# Patient Record
Sex: Female | Born: 1975 | Marital: Married | Smoking: Never smoker
Health system: Southern US, Community
[De-identification: ages and names within clinical notes are randomized; demographics above are authoritative.]

## PROBLEM LIST (undated history)

## (undated) DIAGNOSIS — F329 Major depressive disorder, single episode, unspecified: Secondary | ICD-10-CM

## (undated) DIAGNOSIS — F32A Depression, unspecified: Secondary | ICD-10-CM

## (undated) HISTORY — PX: OTHER SURGICAL HISTORY: SHX169

## (undated) HISTORY — PX: TOE SURGERY: SHX1073

## (undated) HISTORY — PX: MOUTH SURGERY: SHX715

---

## 2008-07-30 ENCOUNTER — Inpatient Hospital Stay (HOSPITAL_COMMUNITY): Admission: EM | Admit: 2008-07-30 | Discharge: 2008-07-31 | Payer: Self-pay | Admitting: Psychiatry

## 2008-07-30 ENCOUNTER — Ambulatory Visit: Payer: Self-pay | Admitting: Psychiatry

## 2008-07-30 ENCOUNTER — Emergency Department (HOSPITAL_COMMUNITY): Admission: EM | Admit: 2008-07-30 | Discharge: 2008-07-30 | Payer: Self-pay | Admitting: Emergency Medicine
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
## 2010-09-18 LAB — COMPREHENSIVE METABOLIC PANEL
AST: 18 U/L (ref 0–37)
BUN: 5 mg/dL — ABNORMAL LOW (ref 6–23)
CO2: 22 mEq/L (ref 19–32)
Chloride: 110 mEq/L (ref 96–112)
Creatinine, Ser: 0.78 mg/dL (ref 0.4–1.2)
GFR calc non Af Amer: >60 mL/min (ref >60)
Glucose, Bld: 99 mg/dL (ref 70–99)
Total Bilirubin: 0.8 mg/dL (ref 0.3–1.2)

## 2010-09-18 LAB — DIFFERENTIAL
Basophils Absolute: 0 10*3/uL (ref 0.0–0.1)
Lymphocytes Relative: 31 % (ref 12–46)
Lymphs Abs: 2.4 10*3/uL (ref 0.7–4.0)
Monocytes Relative: 7 % (ref 3–12)
Neutrophils Relative %: 57 % (ref 43–77)

## 2010-09-18 LAB — CBC
HCT: 36.9 % (ref 36.0–46.0)
Hemoglobin: 12.2 g/dL (ref 12.0–15.0)
MCV: 100.3 fL — ABNORMAL HIGH (ref 78.0–100.0)
RBC: 3.68 MIL/uL — ABNORMAL LOW (ref 3.87–5.11)
WBC: 7.6 10*3/uL (ref 4.0–10.5)

## 2010-09-18 LAB — RAPID URINE DRUG SCREEN, HOSP PERFORMED
Barbiturates: NOT DETECTED
Cocaine: NOT DETECTED
Opiates: NOT DETECTED
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
## 2011-06-04 ENCOUNTER — Encounter: Payer: Self-pay | Admitting: Emergency Medicine

## 2011-06-04 ENCOUNTER — Emergency Department (HOSPITAL_BASED_OUTPATIENT_CLINIC_OR_DEPARTMENT_OTHER)
Admission: EM | Admit: 2011-06-04 | Discharge: 2011-06-05 | Disposition: A | Discharge status: Home / Self Care | Payer: BC Managed Care – PPO | Attending: Emergency Medicine | Admitting: Emergency Medicine

## 2011-06-04 DIAGNOSIS — F3289 Other specified depressive episodes: Secondary | ICD-10-CM | POA: Insufficient documentation

## 2011-06-04 DIAGNOSIS — F329 Major depressive disorder, single episode, unspecified: Secondary | ICD-10-CM

## 2011-06-04 HISTORY — DX: Depression, unspecified: F32.A

## 2011-06-04 HISTORY — DX: Major depressive disorder, single episode, unspecified: F32.9

## 2011-06-04 LAB — CBC
MCH: 32.4 pg (ref 26.0–34.0)
Platelets: 204 10*3/uL (ref 150–400)
RBC: 4.29 MIL/uL (ref 3.87–5.11)

## 2011-06-04 LAB — COMPREHENSIVE METABOLIC PANEL
ALT: 11 U/L (ref 0–35)
AST: 18 U/L (ref 0–37)
CO2: 24 mEq/L (ref 19–32)
Calcium: 9.8 mg/dL (ref 8.4–10.5)
Sodium: 138 mEq/L (ref 135–145)
Total Protein: 8.7 g/dL — ABNORMAL HIGH (ref 6.0–8.3)

## 2011-06-04 MED ORDER — LORAZEPAM 1 MG PO TABS
1.0000 mg | ORAL_TABLET | Freq: Once | ORAL | Status: AC
  Administered 2011-06-04: 1 mg via ORAL
  Filled 2011-06-04: qty 1

## 2011-06-04 NOTE — ED Notes (Signed)
Jaime with [facility] called and sts he will be here in apprx. 1.5hrs. Pt and husband notified.
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
## 2011-06-04 NOTE — ED Notes (Signed)
Marijean Niemann with [facility] sts that he recommends pt be d/c'ed home. He sts the pt already has an appt with her psychiatrist and he feels she can be treated on an outpatient basis.
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
## 2011-06-04 NOTE — ED Notes (Signed)
Jaime with [facility] remains at bedside.

## 2011-06-04 NOTE — ED Notes (Signed)
I spoke with Thurston Hole at [facility] and sts they will call back with an ETA.

## 2011-06-04 NOTE — ED Notes (Signed)
Jaime with [facility] is present in the ED at this time.
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
## 2011-06-05 ENCOUNTER — Ambulatory Visit (HOSPITAL_COMMUNITY)
Admission: RE | Admit: 2011-06-05 | Discharge: 2011-06-05 | Disposition: A | Discharge status: Home / Self Care | Payer: BC Managed Care – PPO | Attending: Psychiatry | Admitting: Psychiatry

## 2012-10-13 ENCOUNTER — Other Ambulatory Visit: Payer: Self-pay | Admitting: Nurse Practitioner

## 2012-10-13 DIAGNOSIS — Z1231 Encounter for screening mammogram for malignant neoplasm of breast: Secondary | ICD-10-CM

## 2012-11-05 ENCOUNTER — Ambulatory Visit
Admission: RE | Admit: 2012-11-05 | Discharge: 2012-11-05 | Disposition: A | Discharge status: Home / Self Care | Payer: BC Managed Care – PPO | Source: Ambulatory Visit | Attending: Nurse Practitioner | Admitting: Nurse Practitioner

## 2012-11-05 DIAGNOSIS — Z1231 Encounter for screening mammogram for malignant neoplasm of breast: Secondary | ICD-10-CM

## 2012-11-06 ENCOUNTER — Other Ambulatory Visit: Payer: Self-pay | Admitting: Nurse Practitioner

## 2012-11-06 DIAGNOSIS — R928 Other abnormal and inconclusive findings on diagnostic imaging of breast: Secondary | ICD-10-CM

## 2012-11-09 ENCOUNTER — Other Ambulatory Visit: Payer: Self-pay | Admitting: Nurse Practitioner

## 2012-11-09 DIAGNOSIS — R928 Other abnormal and inconclusive findings on diagnostic imaging of breast: Secondary | ICD-10-CM

## 2012-11-26 ENCOUNTER — Other Ambulatory Visit: Payer: Self-pay

## 2012-12-03 ENCOUNTER — Ambulatory Visit
Admission: RE | Admit: 2012-12-03 | Discharge: 2012-12-03 | Disposition: A | Discharge status: Home / Self Care | Payer: BC Managed Care – PPO | Source: Ambulatory Visit | Attending: Nurse Practitioner | Admitting: Nurse Practitioner

## 2012-12-03 DIAGNOSIS — R928 Other abnormal and inconclusive findings on diagnostic imaging of breast: Secondary | ICD-10-CM

## 2015-06-08 ENCOUNTER — Emergency Department (HOSPITAL_COMMUNITY): Payer: BLUE CROSS/BLUE SHIELD

## 2015-06-08 ENCOUNTER — Encounter (HOSPITAL_COMMUNITY): Payer: Self-pay | Admitting: *Deleted

## 2015-06-08 ENCOUNTER — Inpatient Hospital Stay (EMERGENCY_DEPARTMENT_HOSPITAL)
Admission: AD | Admit: 2015-06-08 | Discharge: 2015-06-08 | Disposition: A | Discharge status: Home / Self Care | Payer: BLUE CROSS/BLUE SHIELD | Source: Ambulatory Visit | Attending: Family Medicine | Admitting: Family Medicine

## 2015-06-08 ENCOUNTER — Emergency Department (HOSPITAL_COMMUNITY)
Admission: EM | Admit: 2015-06-08 | Discharge: 2015-06-08 | Disposition: A | Discharge status: Home / Self Care | Payer: BLUE CROSS/BLUE SHIELD | Attending: Emergency Medicine | Admitting: Emergency Medicine

## 2015-06-08 ENCOUNTER — Encounter (HOSPITAL_COMMUNITY): Payer: Self-pay | Admitting: Emergency Medicine

## 2015-06-08 DIAGNOSIS — Z880 Allergy status to penicillin: Secondary | ICD-10-CM | POA: Insufficient documentation

## 2015-06-08 DIAGNOSIS — Z79899 Other long term (current) drug therapy: Secondary | ICD-10-CM | POA: Insufficient documentation

## 2015-06-08 DIAGNOSIS — R0602 Shortness of breath: Secondary | ICD-10-CM | POA: Insufficient documentation

## 2015-06-08 DIAGNOSIS — R5383 Other fatigue: Secondary | ICD-10-CM | POA: Insufficient documentation

## 2015-06-08 DIAGNOSIS — R55 Syncope and collapse: Secondary | ICD-10-CM | POA: Diagnosis not present

## 2015-06-08 DIAGNOSIS — R079 Chest pain, unspecified: Secondary | ICD-10-CM | POA: Diagnosis not present

## 2015-06-08 DIAGNOSIS — Z8659 Personal history of other mental and behavioral disorders: Secondary | ICD-10-CM | POA: Diagnosis not present

## 2015-06-08 DIAGNOSIS — R531 Weakness: Secondary | ICD-10-CM | POA: Diagnosis not present

## 2015-06-08 DIAGNOSIS — R200 Anesthesia of skin: Secondary | ICD-10-CM | POA: Diagnosis not present

## 2015-06-08 LAB — BASIC METABOLIC PANEL
ANION GAP: 7 (ref 5–15)
BUN: 8 mg/dL (ref 6–20)
CALCIUM: 9.5 mg/dL (ref 8.9–10.3)
CO2: 26 mmol/L (ref 22–32)
Chloride: 105 mmol/L (ref 101–111)
Creatinine, Ser: 0.81 mg/dL (ref 0.44–1.00)
GFR calc Af Amer: >60 mL/min (ref >60)
Glucose, Bld: 103 mg/dL — ABNORMAL HIGH (ref 65–99)
Potassium: 4.6 mmol/L (ref 3.5–5.1)
Sodium: 138 mmol/L (ref 135–145)

## 2015-06-08 LAB — CBC
HCT: 41.9 % (ref 36.0–46.0)
HEMOGLOBIN: 13.1 g/dL (ref 12.0–15.0)
MCH: 32.5 pg (ref 26.0–34.0)
MCHC: 31.3 g/dL (ref 30.0–36.0)
MCV: 104 fL — ABNORMAL HIGH (ref 78.0–100.0)
Platelets: 290 10*3/uL (ref 150–400)
RBC: 4.03 MIL/uL (ref 3.87–5.11)
RDW: 13.1 % (ref 11.5–15.5)
WBC: 8.5 10*3/uL (ref 4.0–10.5)

## 2015-06-08 LAB — URINALYSIS, ROUTINE W REFLEX MICROSCOPIC
Bilirubin Urine: NEGATIVE
Glucose, UA: NEGATIVE mg/dL
HGB URINE DIPSTICK: NEGATIVE
KETONES UR: NEGATIVE mg/dL
LEUKOCYTES UA: NEGATIVE
Nitrite: NEGATIVE
PROTEIN: NEGATIVE mg/dL
Specific Gravity, Urine: 1.025 (ref 1.005–1.030)
pH: 6 (ref 5.0–8.0)

## 2015-06-08 LAB — I-STAT TROPONIN, ED: TROPONIN I, POC: 0 ng/mL (ref 0.00–0.08)

## 2015-06-08 LAB — D-DIMER, QUANTITATIVE (NOT AT ARMC)

## 2015-06-08 LAB — POCT PREGNANCY, URINE: PREG TEST UR: NEGATIVE

## 2015-06-08 NOTE — MAU Note (Signed)
Yesterday they were coming from [place].  As they were getting close to landing, started coughing. Once they landed felt like there was a lot of mucous in her throat and upper chest, as she was walking her arms and leg felt weak and she felt like she couldn't breath.  Pt passed out.  Husband sat her down; gave her a couple hits off inhaler. People came to her assistance.  Started breathing ok.  Later  Had numbness in rt arm and leg. Pt refused to go to ER.   Around 10 last night had a sharp pain in chest, took a couple aspirin-helped.  Had a 2nd pain later, when coming from [place] around 0100.   No pain or numbness this morning, just feels weak. No prior cardiac hx

## 2015-06-08 NOTE — Discharge Instructions (Signed)
Schedule a follow up appointment with one of the primary care providers listed in the resource guide.   Chest Pain Observation It is often hard to give a specific diagnosis for the cause of chest pain. Among other possibilities your symptoms might be caused by inadequate oxygen delivery to your heart (angina). Angina that is not treated or evaluated can lead to a heart attack (myocardial infarction) or death. Blood tests, electrocardiograms, and X-rays may have been done to help determine a possible cause of your chest pain. After evaluation and observation, your health care provider has determined that it is unlikely your pain was caused by an unstable condition that requires hospitalization. However, a full evaluation of your pain may need to be completed, with additional diagnostic testing as directed. It is very important to keep your follow-up appointments. Not keeping your follow-up appointments could result in permanent heart damage, disability, or death. If there is any problem keeping your follow-up appointments, you must call your health care provider. HOME CARE INSTRUCTIONS  Due to the slight chance that your pain could be angina, it is important to follow your health care provider's treatment plan and also maintain a healthy lifestyle:  Maintain or work toward achieving a healthy weight.  Stay physically active and exercise regularly.  Decrease your salt intake.  Eat a balanced, healthy diet. Talk to a dietitian to learn about heart-healthy foods.  Increase your fiber intake by including whole grains, vegetables, fruits, and nuts in your diet.  Avoid situations that cause stress, anger, or depression.  Take medicines as advised by your health care provider. Report any side effects to your health care provider. Do not stop medicines or adjust the dosages on your own.  Quit smoking. Do not use nicotine patches or gum until you check with your health care provider.  Keep your blood  pressure, blood sugar, and cholesterol levels within normal limits.  Limit alcohol intake to no more than 1 drink per day for women who are not pregnant and 2 drinks per day for men.  Do not abuse drugs. SEEK IMMEDIATE MEDICAL CARE IF: You have severe chest pain or pressure which may include symptoms such as:  You feel pain or pressure in your arms, neck, jaw, or back.  You have severe back or abdominal pain, feel sick to your stomach (nauseous), or throw up (vomit).  You are sweating profusely.  You are having a fast or irregular heartbeat.  You feel short of breath while at rest.  You notice increasing shortness of breath during rest, sleep, or with activity.  You have chest pain that does not get better after rest or after taking your usual medicine.  You wake from sleep with chest pain.  You are unable to sleep because you cannot breathe.  You develop a frequent cough or you are coughing up blood.  You feel dizzy, faint, or experience extreme fatigue.  You develop severe weakness, dizziness, fainting, or chills. Any of these symptoms may represent a serious problem that is an emergency. Do not wait to see if the symptoms will go away. Call your local emergency services (911 in the U.S.). Do not drive yourself to the hospital. MAKE SURE YOU:  Understand these instructions.  Will watch your condition.  Will get help right away if you are not doing well or get worse.   This information is not intended to replace advice given to you by your health care provider. Make sure you discuss any questions you have  with your health care provider.   Document Released: 06/22/2010 Document Revised: 05/25/2013 Document Reviewed: 11/19/2012 Elsevier Interactive Patient Education 2016 Elsevier Inc.  Nonspecific Chest Pain  Chest pain can be caused by many different conditions. There is always a chance that your pain could be related to something serious, such as a heart attack or a  blood clot in your lungs. Chest pain can also be caused by conditions that are not life-threatening. If you have chest pain, it is very important to follow up with your health care provider. CAUSES  Chest pain can be caused by:  Heartburn.  Pneumonia or bronchitis.  Anxiety or stress.  Inflammation around your heart (pericarditis) or lung (pleuritis or pleurisy).  A blood clot in your lung.  A collapsed lung (pneumothorax). It can develop suddenly on its own (spontaneous pneumothorax) or from trauma to the chest.  Shingles infection (varicella-zoster virus).  Heart attack.  Damage to the bones, muscles, and cartilage that make up your chest wall. This can include:  Bruised bones due to injury.  Strained muscles or cartilage due to frequent or repeated coughing or overwork.  Fracture to one or more ribs.  Sore cartilage due to inflammation (costochondritis). RISK FACTORS  Risk factors for chest pain may include:  Activities that increase your risk for trauma or injury to your chest.  Respiratory infections or conditions that cause frequent coughing.  Medical conditions or overeating that can cause heartburn.  Heart disease or family history of heart disease.  Conditions or health behaviors that increase your risk of developing a blood clot.  Having had chicken pox (varicella zoster). SIGNS AND SYMPTOMS Chest pain can feel like:  Burning or tingling on the surface of your chest or deep in your chest.  Crushing, pressure, aching, or squeezing pain.  Dull or sharp pain that is worse when you move, cough, or take a deep breath.  Pain that is also felt in your back, neck, shoulder, or arm, or pain that spreads to any of these areas. Your chest pain may come and go, or it may stay constant. DIAGNOSIS Lab tests or other studies may be needed to find the cause of your pain. Your health care provider may have you take a test called an ambulatory ECG (electrocardiogram).  An ECG records your heartbeat patterns at the time the test is performed. You may also have other tests, such as:  Transthoracic echocardiogram (TTE). During echocardiography, sound waves are used to create a picture of all of the heart structures and to look at how blood flows through your heart.  Transesophageal echocardiogram (TEE).This is a more advanced imaging test that obtains images from inside your body. It allows your health care provider to see your heart in finer detail.  Cardiac monitoring. This allows your health care provider to monitor your heart rate and rhythm in real time.  Holter monitor. This is a portable device that records your heartbeat and can help to diagnose abnormal heartbeats. It allows your health care provider to track your heart activity for several days, if needed.  Stress tests. These can be done through exercise or by taking medicine that makes your heart beat more quickly.  Blood tests.  Imaging tests. TREATMENT  Your treatment depends on what is causing your chest pain. Treatment may include:  Medicines. These may include:  Acid blockers for heartburn.  Anti-inflammatory medicine.  Pain medicine for inflammatory conditions.  Antibiotic medicine, if an infection is present.  Medicines to dissolve blood clots.  Medicines to treat coronary artery disease.  Supportive care for conditions that do not require medicines. This may include:  Resting.  Applying heat or cold packs to injured areas.  Limiting activities until pain decreases. HOME CARE INSTRUCTIONS  If you were prescribed an antibiotic medicine, finish it all even if you start to feel better.  Avoid any activities that bring on chest pain.  Do not use any tobacco products, including cigarettes, chewing tobacco, or electronic cigarettes. If you need help quitting, ask your health care provider.  Do not drink alcohol.  Take medicines only as directed by your health care  provider.  Keep all follow-up visits as directed by your health care provider. This is important. This includes any further testing if your chest pain does not go away.  If heartburn is the cause for your chest pain, you may be told to keep your head raised (elevated) while sleeping. This reduces the chance that acid will go from your stomach into your esophagus.  Make lifestyle changes as directed by your health care provider. These may include:  Getting regular exercise. Ask your health care provider to suggest some activities that are safe for you.  Eating a heart-healthy diet. A registered dietitian can help you to learn healthy eating options.  Maintaining a healthy weight.  Managing diabetes, if necessary.  Reducing stress. SEEK MEDICAL CARE IF:  Your chest pain does not go away after treatment.  You have a rash with blisters on your chest.  You have a fever. SEEK IMMEDIATE MEDICAL CARE IF:   Your chest pain is worse.  You have an increasing cough, or you cough up blood.  You have severe abdominal pain.  You have severe weakness.  You faint.  You have chills.  You have sudden, unexplained chest discomfort.  You have sudden, unexplained discomfort in your arms, back, neck, or jaw.  You have shortness of breath at any time.  You suddenly start to sweat, or your skin gets clammy.  You feel nauseous or you vomit.  You suddenly feel light-headed or dizzy.  Your heart begins to beat quickly, or it feels like it is skipping beats. These symptoms may represent a serious problem that is an emergency. Do not wait to see if the symptoms will go away. Get medical help right away. Call your local emergency services (911 in the U.S.). Do not drive yourself to the hospital.   This information is not intended to replace advice given to you by your health care provider. Make sure you discuss any questions you have with your health care provider.   Document Released:  02/27/2005 Document Revised: 06/10/2014 Document Reviewed: 12/24/2013 Elsevier Interactive Patient Education 2016 Elsevier Inc.  Weakness Weakness is a lack of strength. It may be felt all over the body (generalized) or in one specific part of the body (focal). Some causes of weakness can be serious. You may need further medical evaluation, especially if you are elderly or you have a history of immunosuppression (such as chemotherapy or HIV), kidney disease, heart disease, or diabetes. CAUSES  Weakness can be caused by many different things, including:  Infection.  Physical exhaustion.  Internal bleeding or other blood loss that results in a lack of red blood cells (anemia).  Dehydration. This cause is more common in elderly people.  Side effects or electrolyte abnormalities from medicines, such as pain medicines or sedatives.  Emotional distress, anxiety, or depression.  Circulation problems, especially severe peripheral arterial disease.  Heart disease,  such as rapid atrial fibrillation, bradycardia, or heart failure.  Nervous system disorders, such as Guillain-Barr syndrome, multiple sclerosis, or stroke. DIAGNOSIS  To find the cause of your weakness, your caregiver will take your history and perform a physical exam. Lab tests or X-rays may also be ordered, if needed. TREATMENT  Treatment of weakness depends on the cause of your symptoms and can vary greatly. HOME CARE INSTRUCTIONS   Rest as needed.  Eat a well-balanced diet.  Try to get some exercise every day.  Only take over-the-counter or prescription medicines as directed by your caregiver. SEEK MEDICAL CARE IF:   Your weakness seems to be getting worse or spreads to other parts of your body.  You develop new aches or pains. SEEK IMMEDIATE MEDICAL CARE IF:   You cannot perform your normal daily activities, such as getting dressed and feeding yourself.  You cannot walk up and down stairs, or you feel exhausted  when you do so.  You have shortness of breath or chest pain.  You have difficulty moving parts of your body.  You have weakness in only one area of the body or on only one side of the body.  You have a fever.  You have trouble speaking or swallowing.  You cannot control your bladder or bowel movements.  You have black or bloody vomit or stools. MAKE SURE YOU:  Understand these instructions.  Will watch your condition.  Will get help right away if you are not doing well or get worse.   This information is not intended to replace advice given to you by your health care provider. Make sure you discuss any questions you have with your health care provider.   Document Released: 05/20/2005 Document Revised: 11/19/2011 Document Reviewed: 07/19/2011 Elsevier Interactive Patient Education 2016 [employer].   Emergency Department Resource Guide 1) Find a Doctor and Pay Out of Pocket Although you won't have to find out who is covered by your insurance plan, it is a good idea to ask around and get recommendations. You will then need to call the office and see if the doctor you have chosen will accept you as a new patient and what types of options they offer for patients who are self-pay. Some doctors offer discounts or will set up payment plans for their patients who do not have insurance, but you will need to ask so you aren't surprised when you get to your appointment.  2) Contact Your Local Health Department Not all health departments have doctors that can see patients for sick visits, but many do, so it is worth a call to see if yours does. If you don't know where your local health department is, you can check in your phone book. The CDC also has a tool to help you locate your state's health department, and many state websites also have listings of all of their local health departments.  3) Find a Walk-in Clinic If your illness is not likely to be very severe or complicated, you may  want to try a walk in clinic. These are popping up all over the country in pharmacies, drugstores, and shopping centers. They're usually staffed by nurse practitioners or physician assistants that have been trained to treat common illnesses and complaints. They're usually fairly quick and inexpensive. However, if you have serious medical issues or chronic medical problems, these are probably not your best option.  No Primary Care Doctor: - Call Health Connect at  [phone] - they can help you locate  a primary care doctor that  accepts your insurance, provides certain services, etc. - Physician Referral Service- [phone]  Chronic Pain Problems: Organization         Address  Phone   Notes  [facility]  [phone] Patients need to be referred by their primary care doctor.   Medication Assistance: Organization         Address  Phone   Notes  [facility] Medication [facility] [address] [place] [place], [place] [address] [phone] --Must be a resident of [facility] -- Must have NO insurance coverage whatsoever (no Medicaid/ Medicare, etc.) -- The pt. MUST have a primary care doctor that directs their care regularly and follows them in the community   [facility]  [phone]   [employer]  [phone]    Agencies that provide inexpensive medical care: Organization         Address  Phone   Notes  [facility]  [phone]   [facility]    [phone]   [facility] [address] [place], [place] [address] [phone]   [facility] [address] [place] [place] [phone]   [facility]anned Parenthood    [phone]   [facility]    [phone]   [facility]  [address], [place] Phone:  [phone], Fax:  [phone] Hours of Operation:  9 am - 6 pm, M-F.  Also accepts Medicaid/Medicare and self-pay.   [facility]  [address], [place] Phone: [phone], Fax: [phone]. Hours of Operation:  8:30 am - 5:30 pm, M-F.  Also accepts Medicaid and self-pay.  [facility] [place] [address] [place] Phone: [phone]   [facility] [address] Natasha Bence [place], [place] [phone], Ext. [phone] Mondays & Thursdays: 7-9 AM.  First 15 patients are seen on a first come, first serve basis.    Medicaid-accepting [facility] Providers:  Organization         Address  Phone   Notes  [facility] [address], [place] [phone] Also accepts self-pay patients.  [facility] [address] Laurell Josephs [place], [place]  [phone]   [facility] [address], [place] [phone]   [facility] [address] [place] [phone]   Renaye Rakers [address], [place]   [phone] Only accepts [place] Access [place] patients after they have their name applied to their card.   Self-Pay (no insurance) in [facility]:  Organization         Address  Phone   Notes  Sickle Cell Patients, [facility] [address] [place], [place] [phone]   [facility] [address] [place], [place] [phone]   [facility]  [address],  [phone]   [facility]/Dr. Osei-Bonsu  [address], [place] or [address], [place] [phone] Phone number for both [place] and [place] locations is the same.  [facility] [address] [place] [phone]   [facility] [address], [place] or [address] [phone]  [phone]   [facility] [address], [place] [phone], phone; [phone], fax Sees patients 1st and 3rd Saturday of every month.  Must not qualify for public or private insurance (i.e. Medicaid, Medicare, [place] Health Choice, Veterans' Benefits)  Household income should be no more than 200% of the poverty level The clinic cannot treat you if you are pregnant or think you are pregnant  Sexually transmitted diseases are not treated at the clinic.    Dental Care: Organization         Address  Phone  Notes  [facility] Department of [facility] [address] [place], [place] [phone] Accepts children up to age 35 who are enrolled in [place] or [place] Health Choice; pregnant women with a Medicaid card; and children who have applied for Medicaid or [place] Health Choice, but were declined, whose parents can pay a reduced fee at time of service.  [facility] Department of [facility]  [address], [place] [phone] Accepts children up to age 57 who are enrolled in [place] or [place] Health Choice; pregnant women with a Medicaid card; and children who have applied for Medicaid or [place] Health Choice, but were declined, whose parents can pay a reduced fee at time of service.  [facility]  [address] [place], [place] [phone] Patients are seen by appointment only. Walk-ins are not accepted. [facility] will see patients 42 years of age and older. Monday - Tuesday (8am-5pm) Most Wednesdays (8:30-5pm) $30 per visit, cash only  [facility] Adult Dental Access [facility]  [address], [facility] [phone] Patients are seen by appointment only. Walk-ins are not accepted. [facility] will see patients 53 years of age and older. One Wednesday Evening (Monthly: Volunteer Based).  $30 per visit, cash only  [employer]  [phone] for adults; Children under age 93, call Graduate Pediatric Dentistry at [phone]. Children aged 76-14, please call [phone] to request a pediatric application.  Dental services are provided in all areas of dental care including fillings, crowns and bridges, complete and partial dentures, implants, gum treatment, root canals, and extractions. Preventive care is also provided. Treatment is provided to both adults and children. Patients are selected via a lottery and there is often a waiting list.   [facility] [address] [place]  [phone] [email or web address]   [facility] [address] [place], [place] [phone], Ext. [phone] Second and Fourth Thursday of each month, opens at 6:30 AM; Clinic ends at 9 AM.  Patients are seen on a first-come first-served basis, and a limited number are seen during each clinic.   [facility]  [address] Ether Griffins [place], [place] [phone]   Eligibility Requirements You must have lived in [place], [place], or [place] counties for at least the last three months.   You cannot be eligible for state or federal sponsored [place], including [employer], [place], or [employer].   You generally cannot be eligible for healthcare insurance through your employer.    How to apply: Eligibility screenings are held every Tuesday and Wednesday afternoon from 1:00 pm until 4:00 pm. You do not need an appointment for the interview!  [facility] [address] [place], [place] [phone]   [facility] Health Department  [phone]   [facility] Health Department  [phone]  [facility] Department  [phone]    Behavioral Health Resources in the Community: Intensive Outpatient Programs Organization         Address  Phone  Notes  [facility] [address] [place], [place] [phone]   [facility] [address], [place], [place] [phone]   [facility] [address], [place], [place]  [phone]    [facility] [address],  [place], [place] [phone] or [phone]   Substance Abuse Resources Organization         Address  Phone  Notes  [facility]  [phone]   [facility]  [phone]   [facility] [place]  [phone]   Floydene Flock  [phone]   Residential & Outpatient Substance Abuse Program  [phone]   Psychological Services Organization         Address  Phone  Notes  [facility]  [phone]   [facility]  [phone]   [facility] Mental Health [address], [place] [phone] or [phone]    Mobile Crisis Teams Organization         Address  Phone  Notes  [facility], Mobile Crisis Care Unit  [phone]   [facility]  [address] [place], [place] [phone]   Doristine Locks [address] [place] [phone]    Self-Help/Support Groups Organization         Address  Phone             Notes  [facility] [place] - variety of support groups  336- I7437963 Call for more information  Narcotics Anonymous (NA), [facility] [address], [employer] [place]  2 meetings at this location   Statistician         Address  Phone  Notes  [facility] [address],    [place]  [phone]   [facility]  [address], [place] [address], [place], [place] [phone]   [facility] [address] [place], [place] [phone] Admissions: 8am-3pm M-F  [facility] [address],    [place], [place] [phone]   [facility] [address] [place], [place], [place] [phone]   [facility] [address],  [place], [place] [phone]   [facility] - Intensive Outpatient [address] Laurell Josephs [address], [place], [place] [phone]   [facility] ([facility] [address] [place]  [place], [place] [phone] or [phone]   [facility] ([facility]) [address], [place] [place], [place] [phone] Accepts Medicaid  [facility] [address],  [place] [phone] Substance Abuse/Addiction Treatment   [facility]         Address  Phone  Notes  [facility]  [phone]   Angie Fava, PhD [address] [place]vin Knack [place], [place]   [phone] or [phone]   [facility]   [address] [place], [place] [phone]   [facility] [address] [place], [place] [phone] Insurance/Medicaid/sponsorship through [employer] and Families [address]                                    [place], [place] [phone]  [place], [place] [phone]    Dr. Adele Schilder  [phone]   [facility] 1) [address], [place] 2) [place] 3)  [address], [place] [phone]  [phone]   [place] [phone] or [phone] (After Hours)

## 2015-06-08 NOTE — ED Notes (Signed)
Attempted blood draw in Left AC an was unsuccessful, Rn aware

## 2015-06-08 NOTE — ED Notes (Signed)
Pt declining second EKG for now.  Just had one done at [facility] before coming here

## 2015-06-08 NOTE — Discharge Instructions (Signed)

## 2015-06-08 NOTE — ED Notes (Signed)
Per pt, states [facility]-they did EKG and tested urine-states she passed out in airport last night but did not get checked out

## 2015-06-08 NOTE — MAU Provider Note (Signed)
Chief Complaint:  No chief complaint on file.   First Provider Initiated Contact with Patient 06/08/15 1140    . HPI  Patty Oliver is a 40 y.o. [id number] who presents to maternity admissions reporting Weakness all over. States yesterday had an episode of weakness, chest pain, right sided weakness and numbness, and syncope.  Today has only weakness all over. Had some chest pain last night.    She reports no no GYN complaints, urinary symptoms, h/a, dizziness, n/v, or fever/chills.    Denies history of cardiac problems or asthma.  States her father had heart disease.   RN Note: Burgess EstelleYesterday [place]. As they were getting close to landing, started coughing. Once they landed felt like there was a lot of mucous in her throat and upper chest, as she was walking her arms and leg felt weak and she felt like she couldn't breath. Pt passed out. Husband sat her down; gave her a couple hits off inhaler. People came to her assistance. Started breathing ok. Later Had numbness in rt arm and leg. Pt refused to go to ER. Around 10 last night had a sharp pain in chest, took a couple aspirin-helped. Had a 2nd pain later, when coming from [place] around 0100. No pain or numbness this morning, just feels weak. No prior cardiac hx         Past Medical History: Past Medical History  Diagnosis Date  . Depression     Past obstetric history: OB History  Gravida Para Term Preterm AB SAB TAB Ectopic Multiple Living  2 2 2       2     # Outcome Date GA Lbr Len/2nd Weight Sex Delivery Anes PTL Lv  2 Term           1 Term               Past Surgical History: Past Surgical History  Procedure Laterality Date  . Eye syrgery    . Mouth surgery    . Toe surgery     History of Suicide Attempt 2013  Family History: Family History  Problem Relation Age of Onset  . Heart disease Father   . Hypertension Father     Social History: Social History  Substance Use Topics  . Smoking  status: Never Smoker   . Smokeless tobacco: Never Used  . Alcohol Use: Yes     Comment: occ    Allergies:  Allergies  Allergen Reactions  . Penicillins Swelling    Meds:  Prescriptions prior to admission  Medication Sig Dispense Refill Last Dose  . Iron-Vitamins (GERITOL COMPLETE) TABS Take 1 tablet by mouth daily.     Past Month at Unknown    I have reviewed patient's Past Medical Hx, Surgical Hx, Family Hx, Social Hx, medications and allergies.   ROS:  Review of Systems  Constitutional: Positive for fatigue. Negative for fever and chills.  HENT: Negative for congestion, sore throat and trouble swallowing.   Eyes: Negative for visual disturbance.  Respiratory: Positive for cough. Negative for chest tightness, shortness of breath and wheezing.   Cardiovascular: Negative for chest pain (Did have chest pain yesterday).  Gastrointestinal: Negative for nausea, vomiting, abdominal pain, diarrhea and constipation.  Genitourinary: Negative for vaginal bleeding, vaginal discharge and pelvic pain.  Musculoskeletal: Negative for myalgias and back pain.  Neurological: Positive for weakness. Negative for dizziness, tremors, seizures, facial asymmetry, speech difficulty, light-headedness and headaches.    Physical Exam  Patient Vitals  for the past 24 hrs:  BP Temp Temp src Pulse Resp SpO2 Height Weight  06/08/15 1130 116/83 mmHg 98.9 F (37.2 C) Oral 88 18 100 % 5' 4.25" (1.632 m) 73.029 kg (161 lb)   Constitutional: Well-developed, well-nourished female in no acute distress.  Cardiovascular: normal rate and rhythm, no ectopy audible, S1 & S2 heard, no murmur heard Respiratory: normal effort, no distress. Lungs CTAB with no wheezes or crackles GI: Abd soft, non-tender.  Nondistended.  No rebound, No guarding.  Bowel Sounds audible  MS: Extremities nontender, no edema, normal ROM Neurologic: Alert and oriented x 4.   Grossly nonfocal.  No facial assymetry,  Speaks normally                      Bilateral hand grips and foot dorsi- and plantar flexion strong and equal. GU: Neg CVAT. Skin:  Warm and Dry Psych:  Affect appropriate.     Labs: Results for orders placed or performed during the hospital encounter of 06/08/15 (from the past 24 hour(s))  Pregnancy, urine POC     Status: None   Collection Time: 06/08/15 11:27 AM  Result Value Ref Range   Preg Test, Ur NEGATIVE NEGATIVE      Imaging:  12 Lead EKG done  >>>   Normal Sinus Rhythm, Rate 81  MAU Course/MDM: I have ordered a 12 lead EKG. Consulted ED MD at [facility] and he agrees she should come there for evaluation.  Treatments in MAU included none.   Pt stable at time of discharge.  Assessment: Weakness Prior incident of syncope, right sided weakness/numbness, and chest pain  Plan: Discharge to [facility] link, patient and her husband state he will drive her there now Discussed warning signs of stroke (FAST) and that if she ever has symptoms again, they need to call 911 and go immediately     Medication List    ASK your doctor about these medications        GERITOL COMPLETE Tabs  Take 1 tablet by mouth daily.       Encouraged to return here or to other Urgent Care/ED if she develops worsening of symptoms, increase in pain, fever, or other concerning symptoms.   Wynelle Bourgeois CNM, MSN Certified Nurse-Midwife 06/08/2015 11:48 AM

## 2015-06-08 NOTE — ED Provider Notes (Signed)
CSN: [id number]     Arrival date & time 06/08/15  1229 History   First MD Initiated Contact with Patient 06/08/15 1548     Chief Complaint  Patient presents with  . Weakness   HPI  Patty Oliver is a 40 year old female with PMHx of depression presenting with generalized weakness. She was flying home from [place] yesterday when she developed acute onset of right sided weakness and numbness as she got off the plane. She describes the episode as "not having control of my right hand or foot". Her husband reports sitting her down and she had an apparent syncopal event. Husband reports her "looking sleepy" but he cannot say if she lost consciousness. Denies seizure-like activity. She was also complaining of not being able to breath so he gave her some of his albuterol inhaler. She returned to her baseline after drinking water and caught her connecting flight. Patient and husband are unsure how long the episode lasted. As her next flight was landing, she developed multiple episodes of central chest pain. She describes it as a sharp pain. She reports at least 4-5 episodes that lasted for a few minutes at a time. She denies associated headache, dizziness, diaphoresis, SOB or nausea with her chest pain. The chest pain did not radiate. She has not had any chest pain, SOB or unilateral weakness since. She decided not to seek medical care last evening. She reports waking this morning with generalized weakness and fatigue. Denies a history of cardiac disease or asthma. She was seen by [facility] earlier today for same complaints and transferred to [facility] for further evaluation.   Past Medical History  Diagnosis Date  . Depression    Past Surgical History  Procedure Laterality Date  . Eye syrgery    . Mouth surgery    . Toe surgery     Family History  Problem Relation Age of Onset  . Heart disease Father   . Hypertension Father    Social History  Substance Use Topics  . Smoking status: Never Smoker   .  Smokeless tobacco: Never Used  . Alcohol Use: Yes     Comment: occ   OB History    Gravida Para Term Preterm AB TAB SAB Ectopic Multiple Living   2 2 2       2      Review of Systems  Constitutional: Positive for fatigue. Negative for fever, chills and diaphoresis.  HENT: Negative for congestion, rhinorrhea and sore throat.   Eyes: Negative for visual disturbance.  Respiratory: Positive for shortness of breath. Negative for cough and wheezing.   Cardiovascular: Positive for chest pain. Negative for palpitations.  Gastrointestinal: Negative for nausea, vomiting, abdominal pain and diarrhea.  Genitourinary: Negative for dysuria and flank pain.  Musculoskeletal: Negative for myalgias, back pain and neck pain.  Skin: Negative for rash.  Neurological: Positive for syncope, weakness and numbness. Negative for dizziness, seizures, facial asymmetry, speech difficulty and headaches.  All other systems reviewed and are negative.     Allergies  Penicillins  Home Medications   Prior to Admission medications   Medication Sig Start Date End Date Taking? Authorizing Provider  Iron-Vitamins (GERITOL COMPLETE) TABS Take 1 tablet by mouth daily.     Yes Historical Provider, MD  prednisoLONE acetate (PRED FORTE) 1 % ophthalmic suspension Place 1 drop into the right eye 4 (four) times daily. Reported on 06/08/2015 04/05/15 07/04/15 Yes Historical Provider, MD   BP 113/84 mmHg  Pulse 78  Temp(Src)  98.3 F (36.8 C) (Oral)  Resp 16  SpO2 100%  LMP 05/17/2015 Physical Exam  Constitutional: She is oriented to person, place, and time. She appears well-developed and well-nourished. No distress.  HENT:  Head: Normocephalic and atraumatic.  Mouth/Throat: Oropharynx is clear and moist. No oropharyngeal exudate.  Eyes: Conjunctivae and EOM are normal. Pupils are equal, round, and reactive to light. Right eye exhibits no discharge. Left eye exhibits no discharge. No scleral icterus.  Neck: Normal range of  motion. Neck supple.  Cardiovascular: Normal rate, regular rhythm and normal heart sounds.   Pulmonary/Chest: Effort normal and breath sounds normal. No respiratory distress. She has no wheezes. She has no rales.  Abdominal: Soft. She exhibits no distension. There is no tenderness.  Musculoskeletal: Normal range of motion.  Pt moves all extremities spontaneously and walks with a steady gait  Neurological: She is alert and oriented to person, place, and time. No cranial nerve deficit. Coordination normal.  Cranial nerves 3-12 intact. Major muscle groups with 5/5 motor strength. Sensation to light touch intact. Coordinated finger to nose. Walks with a steady gait.   Skin: Skin is warm and dry.  Psychiatric: She has a normal mood and affect. Her behavior is normal.  Nursing note and vitals reviewed.   ED Course  Procedures (including critical care time) Labs Review Labs Reviewed  BASIC METABOLIC PANEL - Abnormal; Notable for the following:    Glucose, Bld 103 (*)    All other components within normal limits  CBC - Abnormal; Notable for the following:    MCV 104.0 (*)    All other components within normal limits  D-DIMER, QUANTITATIVE (NOT AT [facility])  I-STAT TROPOININ, ED    Imaging Review Ct Head Wo Contrast  06/08/2015  CLINICAL DATA:  40 year old who had a syncopal episode at the airport yesterday when returning from an overseas trip, associated with numbness and weakness in the right arm and right leg. Intermittent episodes of chest pain since yesterday. EXAM: CT HEAD WITHOUT CONTRAST TECHNIQUE: Contiguous axial images were obtained from the base of the skull through the vertex without intravenous contrast. COMPARISON:  07/30/2008. FINDINGS: Ventricular system normal in size and appearance for age. No mass lesion. No midline shift. No acute hemorrhage or hematoma. No extra-axial fluid collections. No evidence of acute infarction. No focal brain parenchymal abnormalities. No significant  interval change. No skull fractures or other focal osseous abnormalities involving the skull. Visualized paranasal sinuses, bilateral mastoid air cells, and bilateral middle ear cavities well-aerated. IMPRESSION: Normal examination. Electronically Signed   By: Hulan Saashomas  Lawrence M.D.   On: 06/08/2015 18:03   I have personally reviewed and evaluated these images and lab results as part of my medical decision-making.   EKG Interpretation None      MDM   Final diagnoses:  Weakness  Chest pain, unspecified chest pain type   40 year old female presenting with episode of right sided numbness and weakness (described as un-coordination) and SOB and then central chest pain a few hours later. Both episodes occurred while getting of a plane. Symptoms occurred last evening without recurrence. Current complaint is generalized weakness and fatigue. No pertinent medical hx other than depression. VSS. Non-focal neuro exam. Heart RRR. Lungs CTAB. Abdomen soft, non-tender. Moves all extremities spontaneously. Lab work unremarkable. D dimer negative. Troponin negative with non-ischemic EKG. CT head negative. Patient's workup is unremarkable and patient states she feels like she is at her baseline. Presentation and workup is not consistent with cardiac or  pulmonary pain origin. Pt has been advised to return to the ED if CP becomes exertional, associated with diaphoresis or nausea, radiates to left jaw/arm, worsens or becomes concerning in any way. At this time there does not appear to be any evidence of an acute emergency medical condition and the patient appears stable for discharge with appropriate outpatient follow up. Diagnosis was discussed with patient who verbalizes understanding and is agreeable to discharge. Pt case discussed with Dr. Freida Busman who agrees with my plan. Return precautions given in discharge paperwork and discussed with pt at bedside. Pt is stable for discharge.   Alveta Heimlich, PA-C 06/08/15  2015  Lorre Nick, MD 06/08/15 [phone]

## 2017-01-01 IMAGING — CT CT HEAD W/O CM
2 series · 16 of 30 positions shown, 20 images · non-contrast
Comparison: 07/30/2008.

CLINICAL DATA: 39-year-old who had a syncopal episode at the
airport yesterday when [place] from an [place] trip, associated
with numbness and weakness in the right arm and right leg.
Intermittent episodes of chest pain since yesterday.

EXAM:
CT HEAD WITHOUT CONTRAST
TECHNIQUE: Contiguous axial images were obtained from the base of the skull
through the vertex without intravenous contrast.

[Series 2: head w/o · axial · non-contrast · 0.40mm/px · z∈[-160,-24]mm · 13 of 33 slices shown, 17 images]
[im 3/33  brain]
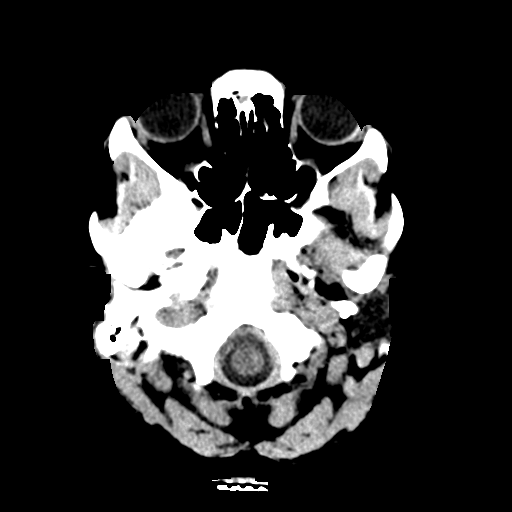
[im 3/33  bone]
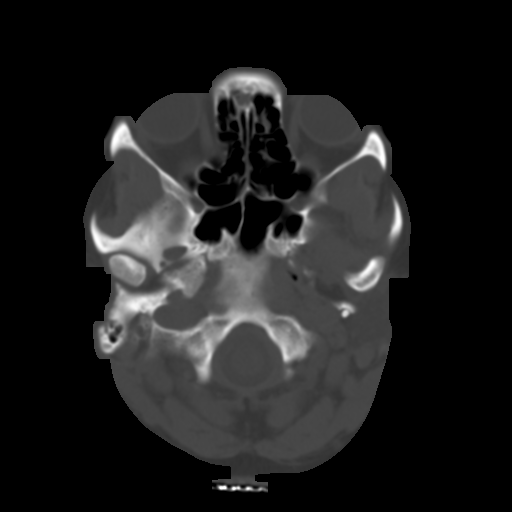
[im 5/33  brain]
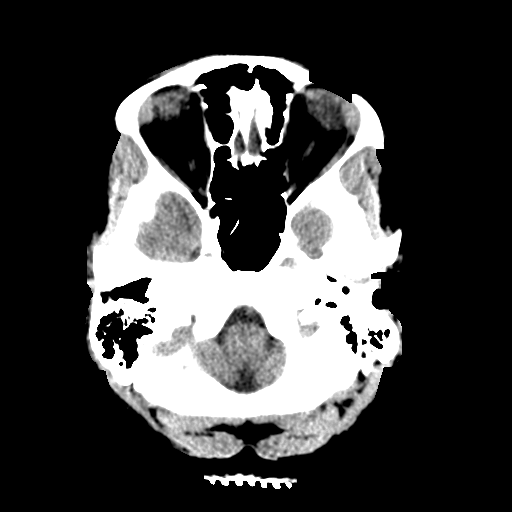
[im 7/33  brain]
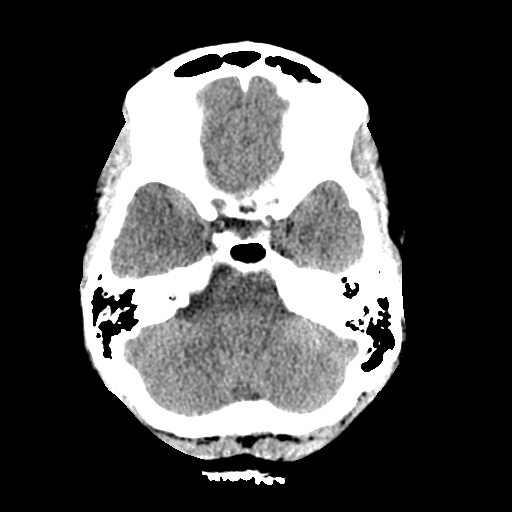
[im 10/33  brain]
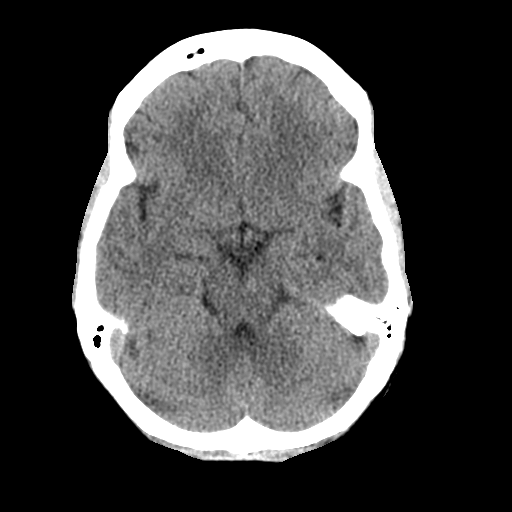
[im 12/33  brain]
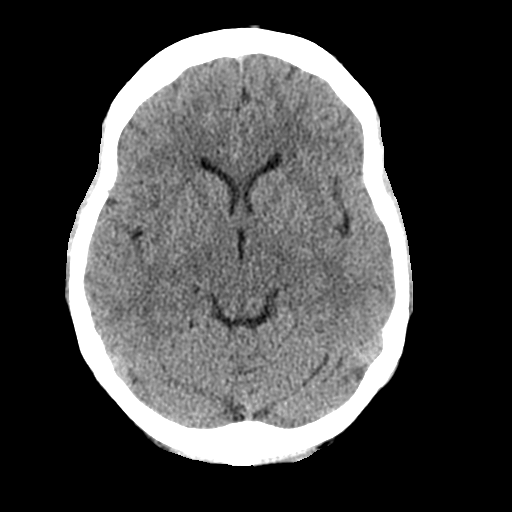
[im 12/33  bone]
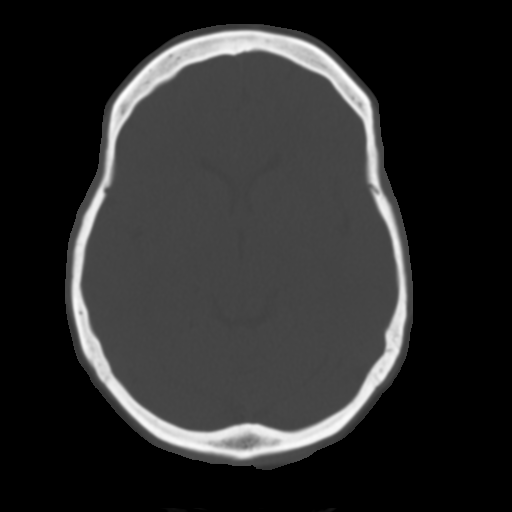
[im 14/33  brain]
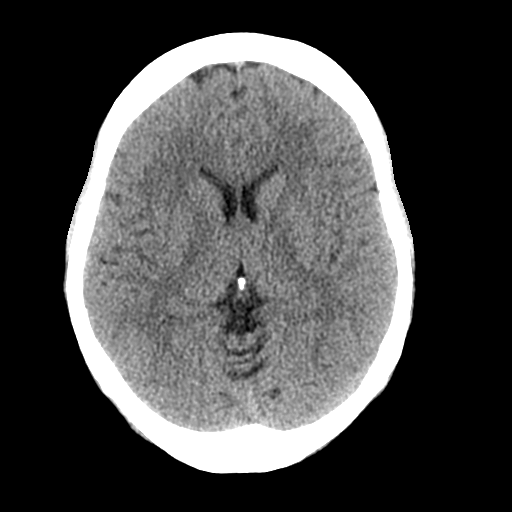
[im 17/33  brain]
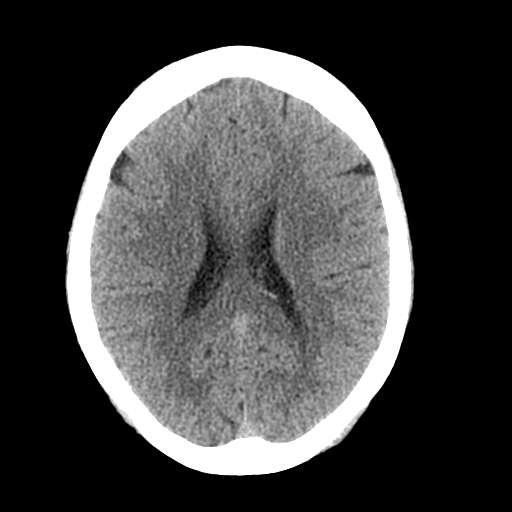
[im 19/33  brain]
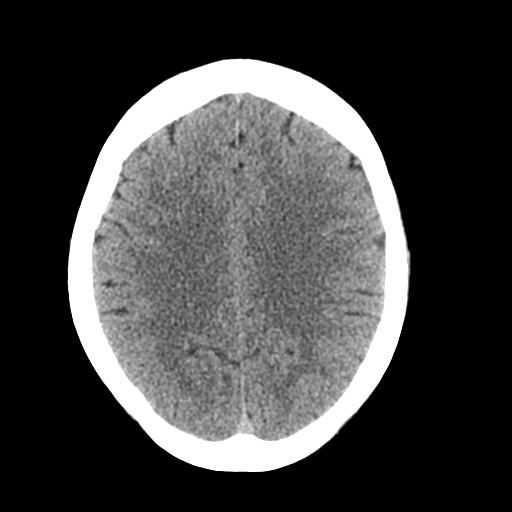
[im 21/33  brain]
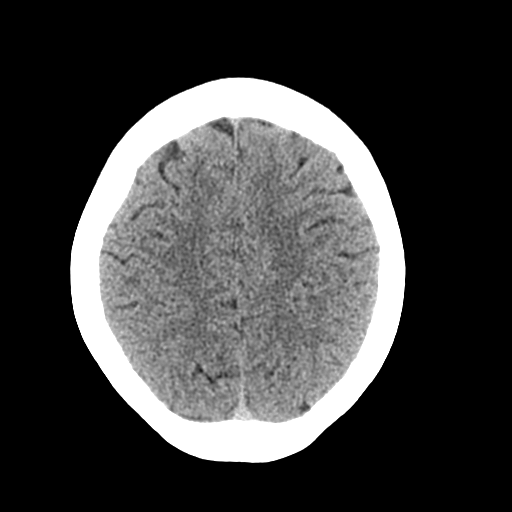
[im 21/33  bone]
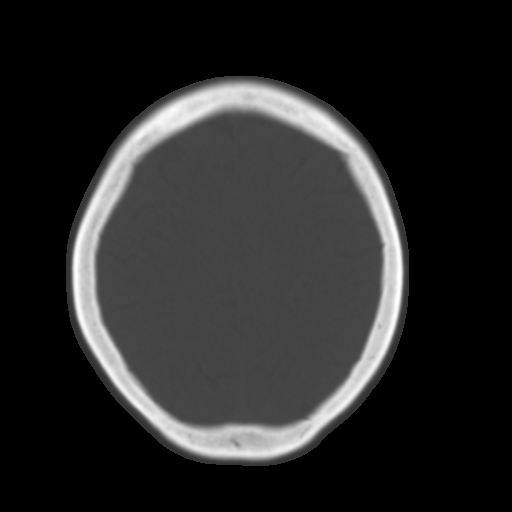
[im 23/33  brain]
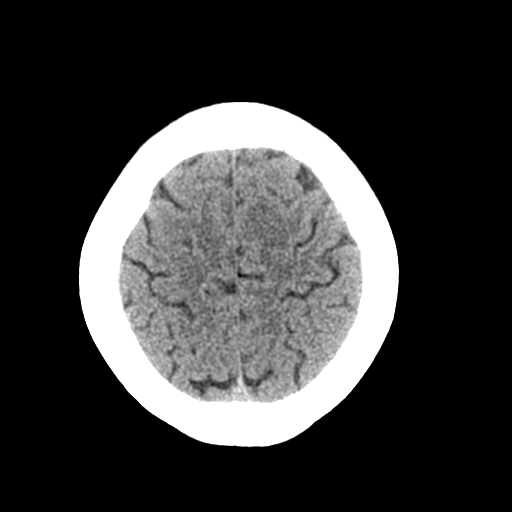
[im 26/33  brain]
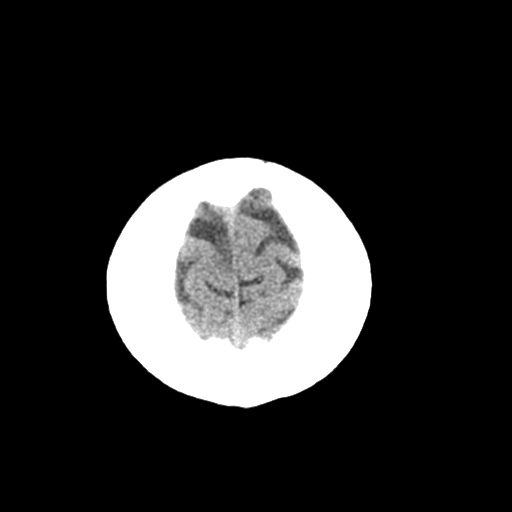
[im 28/33  brain]
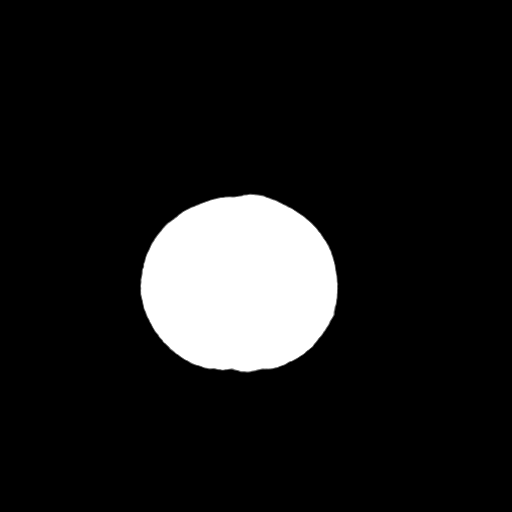
[im 30/33  brain]
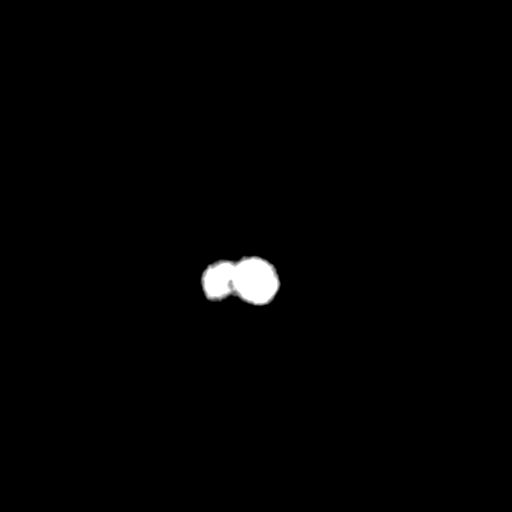
[im 30/33  bone]
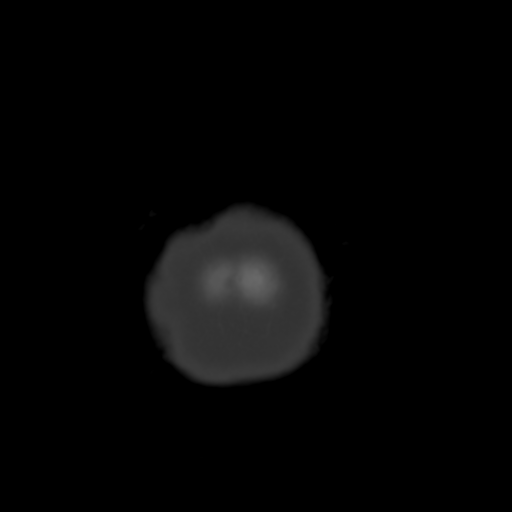

[Series 3: bone windows · axial · 0.40mm/px · z∈[-160,-114]mm · 3 of 33 slices shown]
[im 3/33  bone]
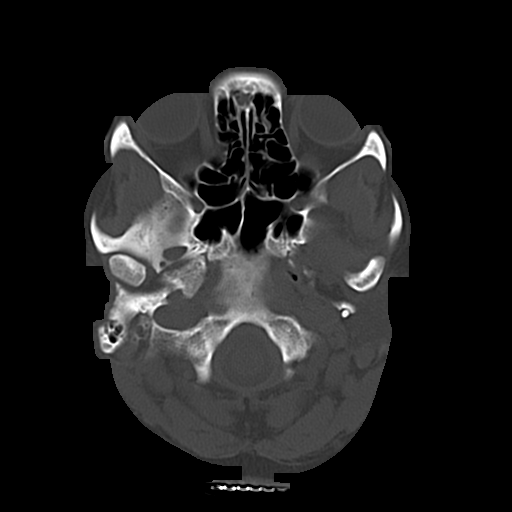
[im 7/33  bone]
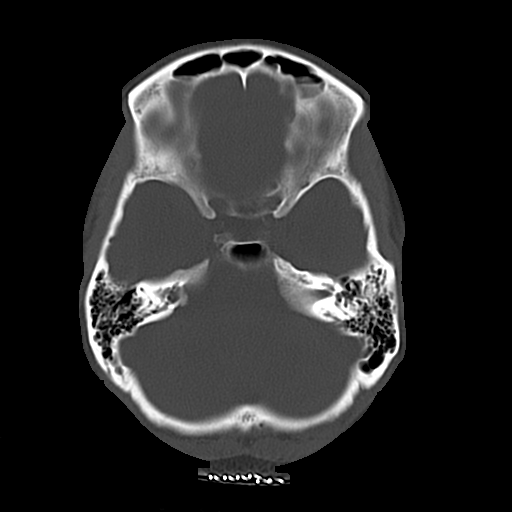
[im 12/33  bone]
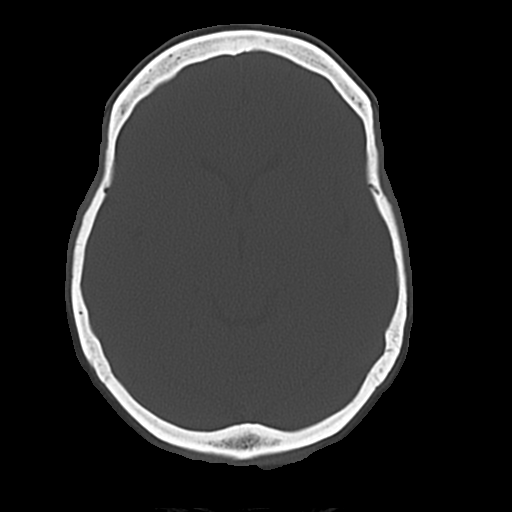

[16 of 30 positions shown; findings below may reference images not displayed]

FINDINGS: Ventricular system normal in size and appearance for age. No mass
lesion. No midline shift. No acute hemorrhage or hematoma. No
extra-axial fluid collections. No evidence of acute infarction. No
focal brain parenchymal abnormalities. No significant interval
change.

No skull fractures or other focal osseous abnormalities involving
the skull. Visualized paranasal sinuses, bilateral mastoid air
cells, and bilateral middle ear cavities well-aerated.
IMPRESSION: Normal examination.

## 2019-09-11 ENCOUNTER — Ambulatory Visit: Payer: BLUE CROSS/BLUE SHIELD | Attending: Internal Medicine

## 2019-09-11 DIAGNOSIS — Z23 Encounter for immunization: Secondary | ICD-10-CM

## 2019-09-11 NOTE — Progress Notes (Signed)
   Covid-19 Vaccination Clinic  Name:  Patty Oliver    MRN: [id number] DOB: [birth year]  09/11/2019  Ms. Patty Oliver was observed post Covid-19 immunization for 30 minutes based on pre-vaccination screening without incident. She was provided with Vaccine Information Sheet and instruction to access the V-Safe system.   Ms. Patty Oliver was instructed to call 911 with any severe reactions post vaccine: Marland Kitchen Difficulty breathing  . Swelling of face and throat  . A fast heartbeat  . A bad rash all over body  . Dizziness and weakness   Immunizations Administered    Name Date Dose VIS Date Route   Pfizer COVID-19 Vaccine 09/11/2019 10:22 AM 0.3 mL 05/14/2019 Intramuscular   Manufacturer: [employer], [employer]   Lot: [id number]   NDC: [address]-3

## 2019-10-05 ENCOUNTER — Ambulatory Visit: Payer: BLUE CROSS/BLUE SHIELD | Attending: Internal Medicine

## 2019-10-05 DIAGNOSIS — Z23 Encounter for immunization: Secondary | ICD-10-CM

## 2019-10-05 NOTE — Progress Notes (Signed)
   Covid-19 Vaccination Clinic  Name:  Patty Oliver    MRN: [id number] DOB: Mar 01, 1976  10/05/2019  [name] was observed post Covid-19 immunization for 15 minutes without incident. She was provided with Vaccine Information Sheet and instruction to access the V-Safe system.   [name] was instructed to call 911 with any severe reactions post vaccine: Marland Kitchen Difficulty breathing  . Swelling of face and throat  . A fast heartbeat  . A bad rash all over body  . Dizziness and weakness   Immunizations Administered    Name Date Dose VIS Date Route   Pfizer COVID-19 Vaccine 10/05/2019  9:23 AM 0.3 mL 07/28/2018 Intramuscular   Manufacturer: [employer], [employer]   Lot: [id number]   NDC: [address]-8
